# Patient Record
Sex: Female | Born: 1975 | Race: White | Hispanic: No | Marital: Married | Smoking: Never smoker
Health system: Southern US, Community
[De-identification: ages and names within clinical notes are randomized; demographics above are authoritative.]

---

## 2014-09-27 ENCOUNTER — Emergency Department
Admission: EM | Admit: 2014-09-27 | Discharge: 2014-09-27 | Disposition: A | Discharge status: Home / Self Care | Payer: BLUE CROSS/BLUE SHIELD | Source: Home / Self Care | Attending: Emergency Medicine | Admitting: Emergency Medicine

## 2014-09-27 ENCOUNTER — Encounter: Payer: Self-pay | Admitting: *Deleted

## 2014-09-27 DIAGNOSIS — J111 Influenza due to unidentified influenza virus with other respiratory manifestations: Secondary | ICD-10-CM

## 2014-09-27 DIAGNOSIS — R69 Illness, unspecified: Principal | ICD-10-CM

## 2014-09-27 MED ORDER — OSELTAMIVIR PHOSPHATE 75 MG PO CAPS: 75.0000 mg | ORAL_CAPSULE | Freq: Two times a day (BID) | ORAL | Status: AC

## 2014-09-27 NOTE — Discharge Instructions (Signed)

## 2014-09-27 NOTE — ED Notes (Signed)
Pt c/o fever, sore throat, body aches, and HA x 1 day. Took Tylenol at 1600. Daughter is being treated for the flu currently.

## 2014-09-28 NOTE — ED Provider Notes (Signed)
CSN: [id number]     Arrival date & time 09/27/14  1911 History   First MD Initiated Contact with Patient 09/27/14 1927     Chief Complaint  Patient presents with  . Fever  . Sore Throat  . Generalized Body Aches   (Consider location/radiation/quality/duration/timing/severity/associated sxs/prior Treatment) Patient is a 39 y.o. female presenting with fever and pharyngitis. The history is provided by the patient. No language interpreter was used.  Fever Max temp prior to arrival:  Fever Temp source:  Subjective Severity:  Moderate Onset quality:  Gradual Duration:  1 day Timing:  Constant Progression:  Worsening Chronicity:  New Relieved by:  None tried Worsened by:  Nothing tried Ineffective treatments:  None tried Associated symptoms: headaches   Sore Throat Associated symptoms include headaches.    History reviewed. No pertinent past medical history. History reviewed. No pertinent past surgical history. History reviewed. No pertinent family history. History  Substance Use Topics  . Smoking status: Never Smoker   . Smokeless tobacco: Not on file  . Alcohol Use: Yes   OB History    No data available     Review of Systems  Constitutional: Positive for fever.  Neurological: Positive for headaches.  All other systems reviewed and are negative.   Allergies  Bactrim  Home Medications   Prior to Admission medications   Medication Sig Start Date End Date Taking? Authorizing Provider  oseltamivir (TAMIFLU) 75 MG capsule Take 1 capsule (75 mg total) by mouth every 12 (twelve) hours. 09/27/14   Elson AreasLeslie K Mosi Hannold, PA-C   BP 112/68 mmHg  Pulse 96  Temp(Src) 98.7 F (37.1 C) (Oral)  Resp 16  Ht 5\' 8"  (1.727 m)  Wt 165 lb (74.844 kg)  BMI 25.09 kg/m2  SpO2 99%  LMP 09/19/2014 Physical Exam  Constitutional: She is oriented to person, place, and time. She appears well-developed and well-nourished.  HENT:  Head: Normocephalic.  Right Ear: External ear normal.  Eyes:  Conjunctivae and EOM are normal. Pupils are equal, round, and reactive to light.  Neck: Normal range of motion.  Cardiovascular: Normal rate and normal heart sounds.   Pulmonary/Chest: Effort normal.  Abdominal: Soft. She exhibits no distension.  Musculoskeletal: Normal range of motion.  Neurological: She is alert and oriented to person, place, and time.  Skin: Skin is warm.  Psychiatric: She has a normal mood and affect.  Nursing note and vitals reviewed.   ED Course  Procedures (including critical care time) Labs Review Labs Reviewed - No data to display  Imaging Review No results found.   MDM  Daughter has influenza.   I will treat pt  With tamiflu.   1. Influenza-like illness    Tamiflu AVS    Lonia SkinnerLeslie K [place], PA-C 09/28/14 [phone]

## 2018-02-03 ENCOUNTER — Emergency Department
Admission: EM | Admit: 2018-02-03 | Discharge: 2018-02-03 | Disposition: A | Discharge status: Home / Self Care | Payer: BLUE CROSS/BLUE SHIELD | Source: Home / Self Care | Attending: Family Medicine | Admitting: Family Medicine

## 2018-02-03 ENCOUNTER — Encounter: Payer: Self-pay | Admitting: Emergency Medicine

## 2018-02-03 ENCOUNTER — Emergency Department (INDEPENDENT_AMBULATORY_CARE_PROVIDER_SITE_OTHER): Payer: BLUE CROSS/BLUE SHIELD

## 2018-02-03 DIAGNOSIS — N201 Calculus of ureter: Secondary | ICD-10-CM | POA: Diagnosis not present

## 2018-02-03 DIAGNOSIS — N132 Hydronephrosis with renal and ureteral calculous obstruction: Secondary | ICD-10-CM | POA: Diagnosis not present

## 2018-02-03 DIAGNOSIS — N23 Unspecified renal colic: Secondary | ICD-10-CM

## 2018-02-03 LAB — POCT URINALYSIS DIP (MANUAL ENTRY)
Bilirubin, UA: NEGATIVE
Glucose, UA: NEGATIVE mg/dL
Nitrite, UA: NEGATIVE
Spec Grav, UA: 1.025 (ref 1.010–1.025)
Urobilinogen, UA: 0.2 E.U./dL
pH, UA: 6 (ref 5.0–8.0)

## 2018-02-03 MED ORDER — KETOROLAC TROMETHAMINE 60 MG/2ML IM SOLN
60.0000 mg | Freq: Once | INTRAMUSCULAR | Status: AC
  Administered 2018-02-03: 60 mg via INTRAMUSCULAR

## 2018-02-03 MED ORDER — NAPROXEN 500 MG PO TABS: 500.0000 mg | ORAL_TABLET | Freq: Two times a day (BID) | ORAL | 0 refills | Status: AC

## 2018-02-03 MED ORDER — TAMSULOSIN HCL 0.4 MG PO CAPS: 0.4000 mg | ORAL_CAPSULE | Freq: Every day | ORAL | 0 refills | Status: AC

## 2018-02-03 MED ORDER — PHENAZOPYRIDINE HCL 200 MG PO TABS: 200.0000 mg | ORAL_TABLET | Freq: Three times a day (TID) | ORAL | 0 refills | Status: AC

## 2018-02-03 NOTE — ED Provider Notes (Signed)
[facility]    CSN: [id number] Arrival date & time: 02/03/18  1703     History   Chief Complaint Chief Complaint  Patient presents with  . Back Pain    HPI Monica Burns is a 42 y.o. female.   HPI  Monica Burns is a 42 y.o. female presenting to UC with c/o lower back pain for about 2 days. Pain is moderate to severe. She is concerned she has kidney stones and states it is her "kidneys" that are hurting, not her "back." yesterday she had severe pain that brought her to her knees, associated nausea and vomiting from the pain. Her friend, who has had kidney stones, advised pt she had the same symptoms.  Pt thought the stone had passed but the pain returned again today.  Denies fever, chills, dysuria, or hematuria. Pain is sharp and cramping at times, waxes and wanes. No prior hx of kidney stones.    History reviewed. No pertinent past medical history.  There are no active problems to display for this patient.   History reviewed. No pertinent surgical history.  OB History   None      Home Medications    Prior to Admission medications   Medication Sig Start Date End Date Taking? Authorizing Provider  naproxen (NAPROSYN) 500 MG tablet Take 1 tablet (500 mg total) by mouth 2 (two) times daily. 02/03/18   Lurene Shadow, PA-C  oseltamivir (TAMIFLU) 75 MG capsule Take 1 capsule (75 mg total) by mouth every 12 (twelve) hours. 09/27/14   Elson Areas, PA-C  phenazopyridine (PYRIDIUM) 200 MG tablet Take 1 tablet (200 mg total) by mouth 3 (three) times daily. 02/03/18   Lurene Shadow, PA-C  tamsulosin (FLOMAX) 0.4 MG CAPS capsule Take 1 capsule (0.4 mg total) by mouth daily. 02/03/18   Lurene Shadow, PA-C    Family History History reviewed. No pertinent family history.  Social History Social History   Tobacco Use  . Smoking status: Never Smoker  . Smokeless tobacco: Never Used  Substance Use Topics  . Alcohol use: Yes  . Drug use: No     Allergies   Bactrim  [sulfamethoxazole-trimethoprim]   Review of Systems Review of Systems  Constitutional: Negative for chills and fever.  Gastrointestinal: Positive for abdominal pain, nausea and vomiting. Negative for diarrhea.  Genitourinary: Positive for flank pain ( bilateral). Negative for dysuria, frequency and hematuria.  Musculoskeletal: Positive for back pain. Negative for myalgias.     Physical Exam Triage Vital Signs ED Triage Vitals  Enc Vitals Group     BP 02/03/18 1729 127/75     Pulse Rate 02/03/18 1729 86     Resp --      Temp 02/03/18 1729 97.9 F (36.6 C)     Temp Source 02/03/18 1729 Oral     SpO2 02/03/18 1729 100 %     Weight 02/03/18 1730 145 lb (65.8 kg)     Height --      Head Circumference --      Peak Flow --      Pain Score 02/03/18 1730 3     Pain Loc --      Pain Edu? --      Excl. in GC? --    No data found.  Updated Vital Signs BP 127/75 (BP Location: Right Arm)   Pulse 86   Temp 97.9 F (36.6 C) (Oral)   Wt 145 lb (65.8 kg)   LMP 01/27/2018 (Approximate)  SpO2 100%   BMI 22.05 kg/m   Visual Acuity Right Eye Distance:   Left Eye Distance:   Bilateral Distance:    Right Eye Near:   Left Eye Near:    Bilateral Near:     Physical Exam  Constitutional: She is oriented to person, place, and time. She appears well-developed and well-nourished.  Pt sitting in exam chair hold/massaging her mid back. Appears uncomfortable but alert and cooperative during exam.   HENT:  Head: Normocephalic and atraumatic.  Eyes: EOM are normal.  Neck: Normal range of motion.  Cardiovascular: Normal rate and regular rhythm.  Pulmonary/Chest: Effort normal and breath sounds normal. No stridor. No respiratory distress. She has no wheezes. She has no rales.  Abdominal: Soft. She exhibits no distension. There is no tenderness. There is no CVA tenderness.  Musculoskeletal: Normal range of motion. She exhibits tenderness. She exhibits no edema.  No midline spinal  tenderness. Mild tenderness to thoracic muscles. Full ROM upper and lower extremities.   Neurological: She is alert and oriented to person, place, and time.  Skin: Skin is warm and dry. No rash noted. She is not diaphoretic. No erythema.  Psychiatric: She has a normal mood and affect. Her behavior is normal.  Nursing note and vitals reviewed.    UC Treatments / Results  Labs (all labs ordered are listed, but only abnormal results are displayed) Labs Reviewed  POCT URINALYSIS DIP (MANUAL ENTRY) - Abnormal; Notable for the following components:      Result Value   Clarity, UA cloudy (*)    Ketones, POC UA trace (5) (*)    Blood, UA trace-lysed (*)    Protein Ur, POC trace (*)    Leukocytes, UA Trace (*)    All other components within normal limits  URINE CULTURE    EKG None  Radiology CLINICAL DATA:  Left flank pain for the past 3 days.  EXAM: CT ABDOMEN AND PELVIS WITHOUT CONTRAST  TECHNIQUE: Multidetector CT imaging of the abdomen and pelvis was performed following the standard protocol without IV contrast.  COMPARISON:  None.  FINDINGS: Lower chest: No acute abnormality.  Hepatobiliary: No focal liver abnormality is seen. No gallstones, gallbladder wall thickening, or biliary dilatation.  Pancreas: Unremarkable. No pancreatic ductal dilatation or surrounding inflammatory changes.  Spleen: Normal in size without focal abnormality.  Adrenals/Urinary Tract: The adrenal glands and right kidney are unremarkable. There is a 5 mm calculus in the distal left ureter just proximal to the UVJ. There is resultant mild to moderate left hydroureteronephrosis and mild left perinephric fat stranding. The bladder is decompressed.  Stomach/Bowel: Stomach is within normal limits. Appendix is not identified, but there are no inflammatory changes at the base of the cecum. No evidence of bowel wall thickening, distention, or inflammatory changes. Moderate increased stool  burden in the right and transverse colon.  Vascular/Lymphatic: No significant vascular findings are present. No enlarged abdominal or pelvic lymph nodes.  Reproductive: Uterus and bilateral adnexa are unremarkable.  Other: Tiny fat containing umbilical hernia. No free fluid or pneumoperitoneum. Multiple phleboliths in the pelvis.  Musculoskeletal: No acute or significant osseous findings.  IMPRESSION: 1. 5 mm calculus in the distal left ureter just proximal to the UVJ. Resultant mild to moderate left hydroureteronephrosis.   Electronically Signed   By: Obie Dredge M.D.   On: 02/03/2018 18:38    Procedures Procedures (including critical care time)  Medications Ordered in UC Medications  ketorolac (TORADOL) injection 60 mg (60 mg Intramuscular  Given 02/03/18 1902)    Initial Impression / Assessment and Plan / UC Course  I have reviewed the triage vital signs and the nursing notes.  Pertinent labs & imaging results that were available during my care of the patient were reviewed by me and considered in my medical decision making (see chart for details).     Discussed imaging with pt. Consulted with Dr. Cathren Harsh Recommend pain management and flomax, f/u with urology outpatient. Discussed symptoms that warrant emergent care in the ED.  Final Clinical Impressions(s) / UC Diagnoses   Final diagnoses:  Left ureteral stone  Renal colic     Discharge Instructions      You were given a shot of an antiinflammatory, Toradol this evening for pain.  You may take your next dose of an antiinflammatory medication in 8 hours.    You may take the Naproxen as prescribed after 3AM. It should then be taken every 12 hours to help keep the pain and inflammation down. You may start the Flomax (tamsulosin) to help pass the stone and pyridium this evening to help with urinary symptoms.  It is important to stay well hydrated with clear fluids.  Please call to schedule a follow  up appointment with a urologist this week if not improving.   Your stone was measured to be 3mm in size.   50% of the patients are able to pass a stone this size without any surgical intervention.  If symptoms worsen- unable to pass urine, develop a fever, worsening persistent pain, please go to the hospital for further evaluation and treatment.      ED Prescriptions    Medication Sig Dispense Auth. Provider   tamsulosin (FLOMAX) 0.4 MG CAPS capsule Take 1 capsule (0.4 mg total) by mouth daily. 14 capsule Doroteo Glassman, Dorleen Kissel O, PA-C   naproxen (NAPROSYN) 500 MG tablet Take 1 tablet (500 mg total) by mouth 2 (two) times daily. 30 tablet Lurene Shadow, PA-C   phenazopyridine (PYRIDIUM) 200 MG tablet Take 1 tablet (200 mg total) by mouth 3 (three) times daily. 6 tablet Lurene Shadow, PA-C     Controlled Substance Prescriptions  Controlled Substance Registry consulted? Pt is on Suboxone as recent as July 2019.  Pt is not good candidate for narcotic pain medication at this time.    Lurene Shadow, PA-C 02/06/18 0830

## 2018-02-03 NOTE — ED Triage Notes (Signed)
Pt c/o low back pain x2 days.

## 2018-02-03 NOTE — Discharge Instructions (Addendum)
°  You were given a shot of an antiinflammatory, Toradol this evening for pain.  You may take your next dose of an antiinflammatory medication in 8 hours.    You may take the Naproxen as prescribed after 3AM. It should then be taken every 12 hours to help keep the pain and inflammation down. You may start the Flomax (tamsulosin) to help pass the stone and pyridium this evening to help with urinary symptoms.  It is important to stay well hydrated with clear fluids.  Please call to schedule a follow up appointment with a urologist this week if not improving.   Your stone was measured to be 47mm in size.   50% of the patients are able to pass a stone this size without any surgical intervention.  If symptoms worsen- unable to pass urine, develop a fever, worsening persistent pain, please go to the hospital for further evaluation and treatment.

## 2018-02-04 LAB — URINE CULTURE
MICRO NUMBER:: 91049616
Result:: NO GROWTH
SPECIMEN QUALITY:: ADEQUATE

## 2018-02-05 ENCOUNTER — Telehealth: Payer: Self-pay

## 2018-02-05 NOTE — Telephone Encounter (Signed)
Attempted to call. VM full

## 2018-02-06 NOTE — Telephone Encounter (Signed)
Attempted to call. VM full

## 2019-12-25 IMAGING — CT CT RENAL STONE PROTOCOL
2 of 4 series · 17 of 46 positions shown, 19 images · non-contrast
Comparison: None.

CLINICAL DATA: Left flank pain for the past 3 days.

EXAM:
CT ABDOMEN AND PELVIS WITHOUT CONTRAST
TECHNIQUE: Multidetector CT imaging of the abdomen and pelvis was performed
following the standard protocol without IV contrast.

[Series 2: axial st · axial · 0.89mm/px · z∈[-434,-29]mm · 14 of 89 slices shown, 16 images]
[im 4/89  soft-tissue]
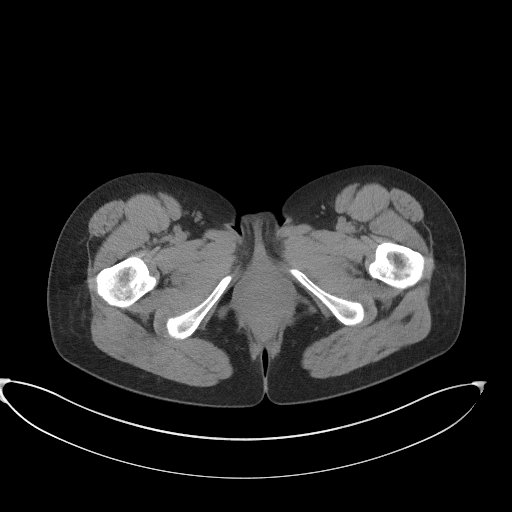
[im 4/89  bone]
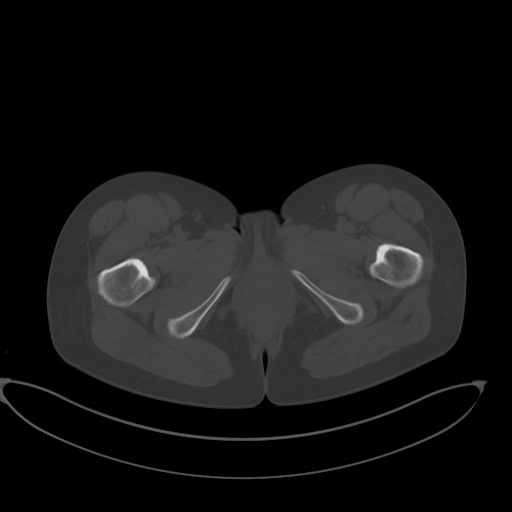
[im 12/89  soft-tissue]
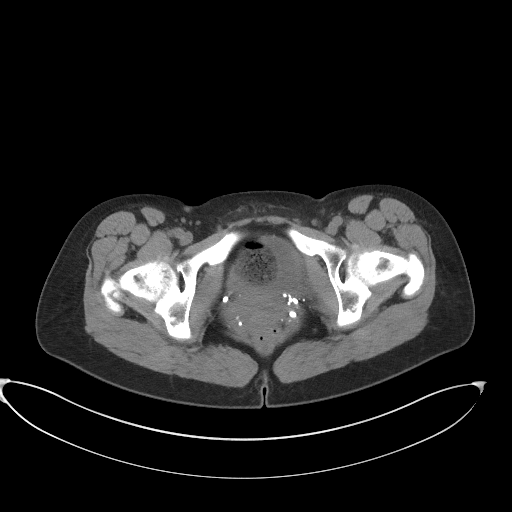
[im 19/89  soft-tissue]
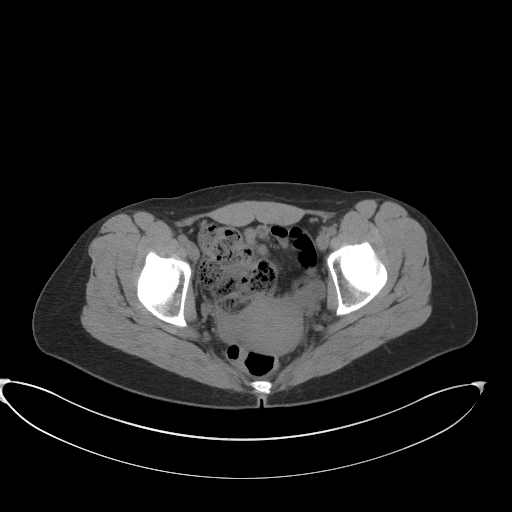
[im 23/89  soft-tissue]
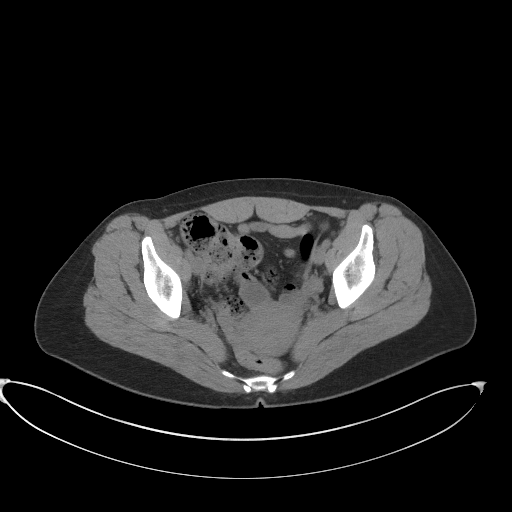
[im 30/89  soft-tissue]
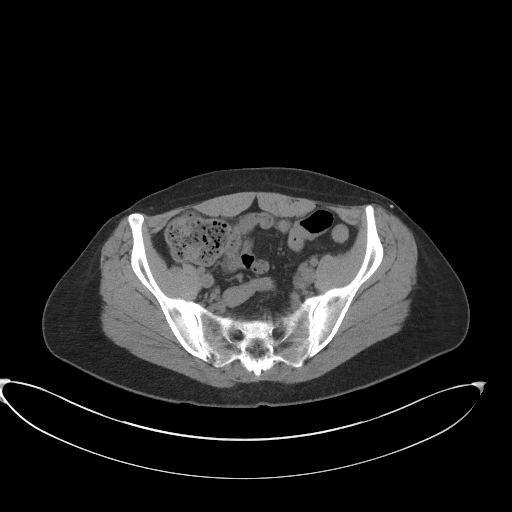
[im 37/89  soft-tissue]
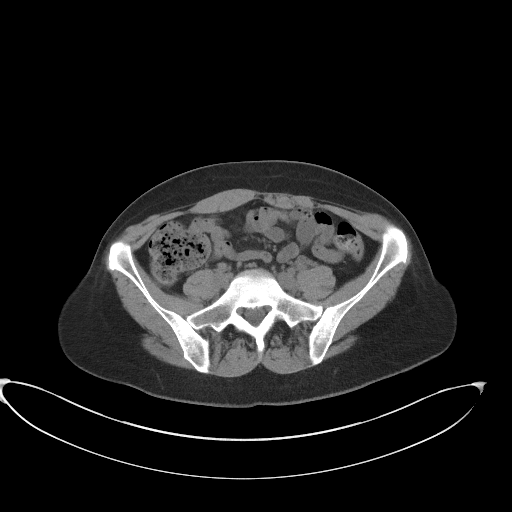
[im 41/89  soft-tissue]
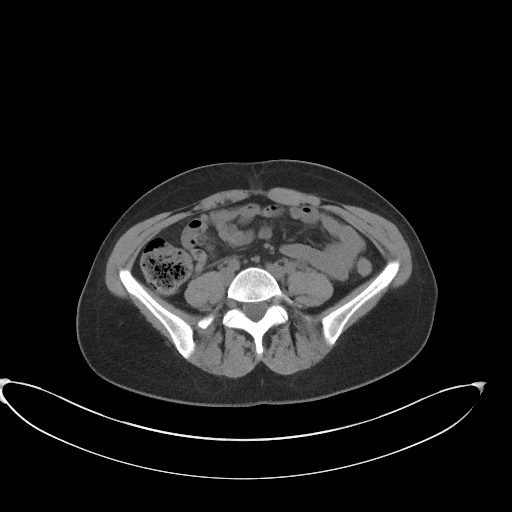
[im 48/89  soft-tissue]
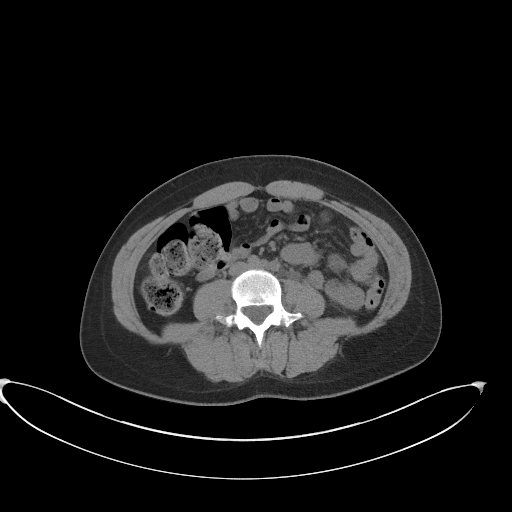
[im 52/89  soft-tissue]
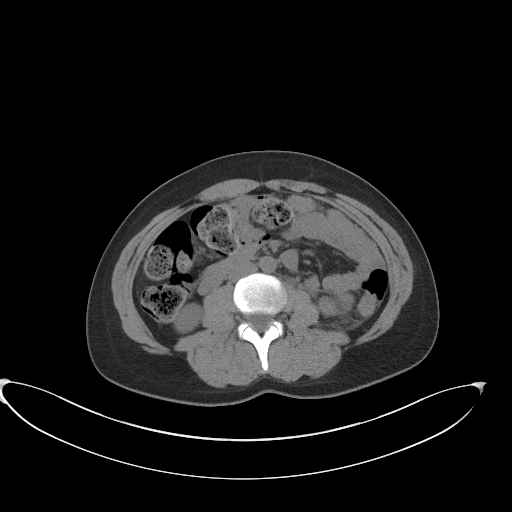
[im 52/89  bone]
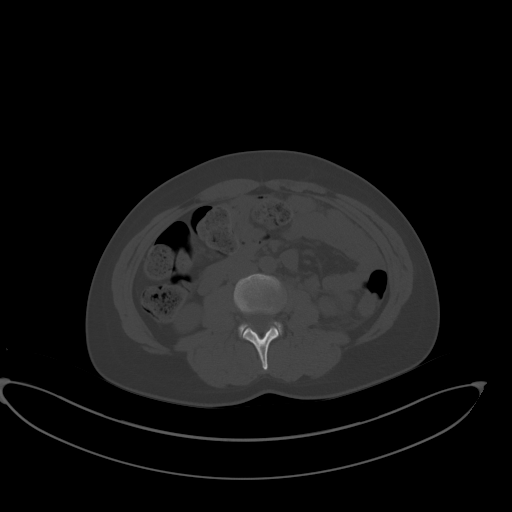
[im 59/89  soft-tissue]
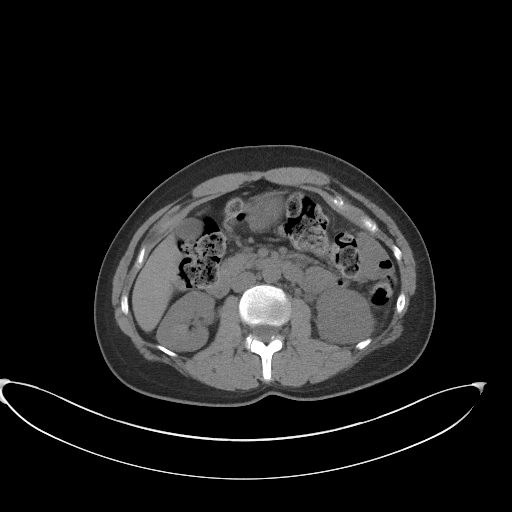
[im 67/89  soft-tissue]
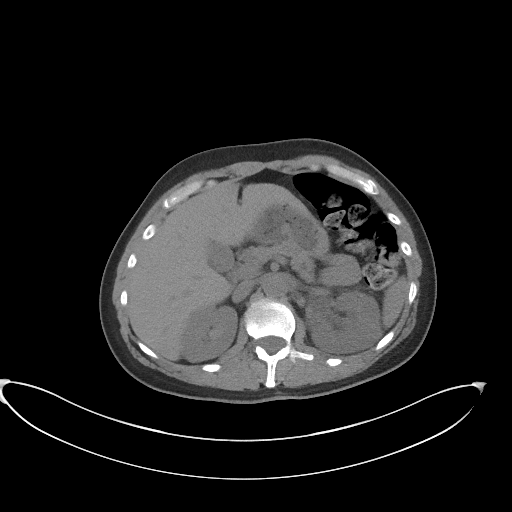
[im 70/89  soft-tissue]
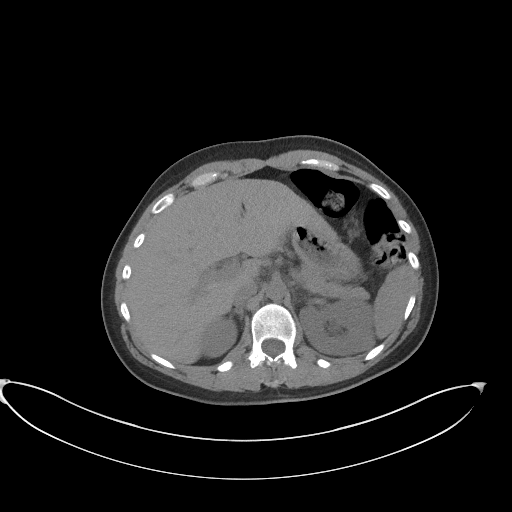
[im 78/89  soft-tissue]
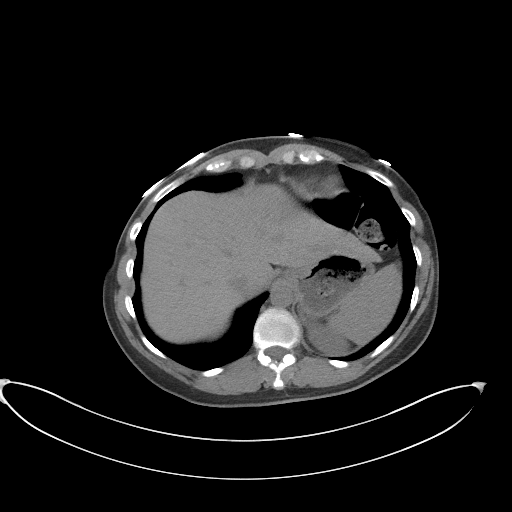
[im 85/89  soft-tissue]
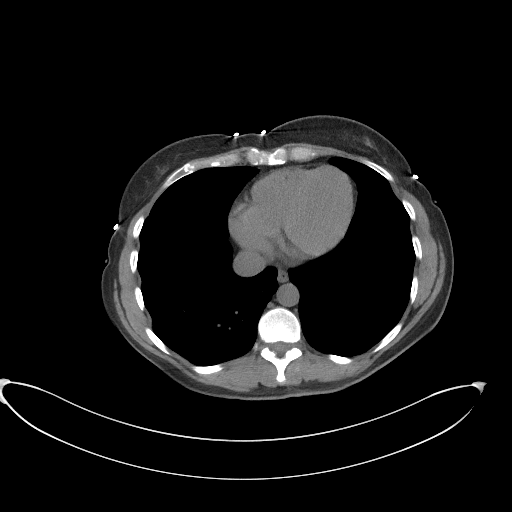

[Series 4: coronal st · coronal · 0.82mm/px · 3 of 89 slices shown]
[im 30/89  soft-tissue]
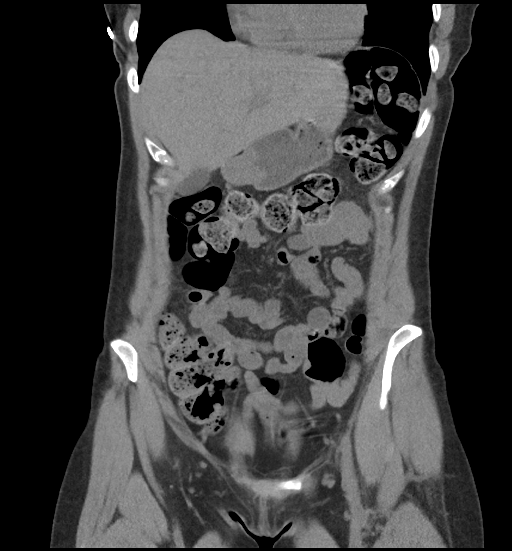
[im 40/89  soft-tissue]
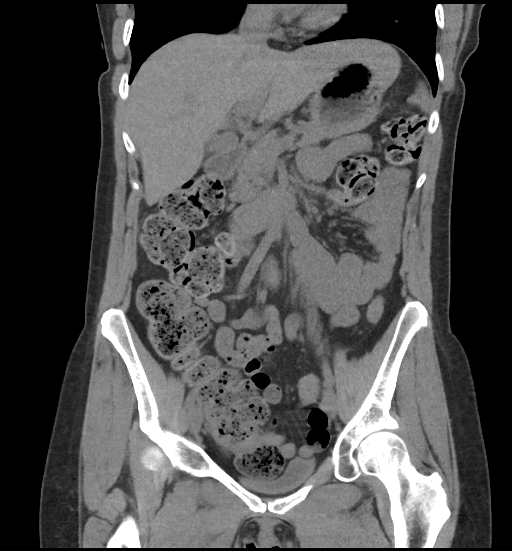
[im 49/89  soft-tissue]
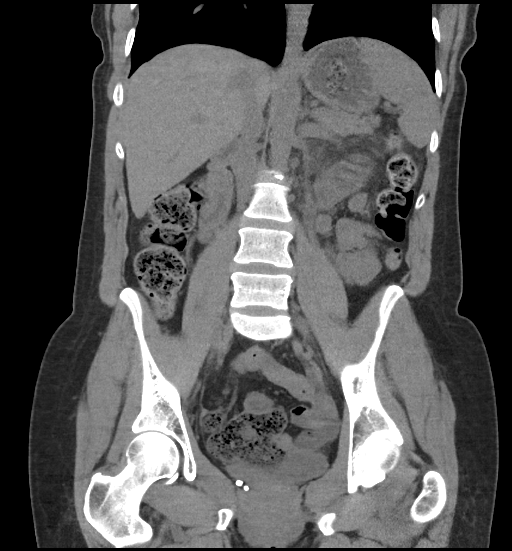

[17 of 46 positions shown; findings below may reference images not displayed]

FINDINGS: Lower chest: No acute abnormality.

Hepatobiliary: No focal liver abnormality is seen. No gallstones,
gallbladder wall thickening, or biliary dilatation.

Pancreas: Unremarkable. No pancreatic ductal dilatation or
surrounding inflammatory changes.

Spleen: Normal in size without focal abnormality.

Adrenals/Urinary Tract: The adrenal glands and right kidney are
unremarkable. There is a 5 mm calculus in the distal left ureter
just proximal to the UVJ. There is resultant mild to moderate left
hydroureteronephrosis and mild left perinephric fat stranding. The
bladder is decompressed.

Stomach/Bowel: Stomach is within normal limits. Appendix is not
identified, but there are no inflammatory changes at the base of the
cecum. No evidence of bowel wall thickening, distention, or
inflammatory changes. Moderate increased stool burden in the right
and transverse colon.

Vascular/Lymphatic: No significant vascular findings are present. No
enlarged abdominal or pelvic lymph nodes.

Reproductive: Uterus and bilateral adnexa are unremarkable.

Other: Tiny fat containing umbilical hernia. No free fluid or
pneumoperitoneum. Multiple phleboliths in the pelvis.

Musculoskeletal: No acute or significant osseous findings.
IMPRESSION: 1. 5 mm calculus in the distal left ureter just proximal to the UVJ.
Resultant mild to moderate left hydroureteronephrosis.
